# Patient Record
Sex: Female | Born: 1985 | Race: White | Hispanic: No | Marital: Married | Smoking: Never smoker
Health system: Southern US, Community
[De-identification: ages and names within clinical notes are randomized; demographics above are authoritative.]

## PROBLEM LIST (undated history)

## (undated) ENCOUNTER — Inpatient Hospital Stay (HOSPITAL_COMMUNITY): Payer: Self-pay

## (undated) DIAGNOSIS — Z86011 Personal history of benign neoplasm of the brain: Secondary | ICD-10-CM

## (undated) HISTORY — PX: WISDOM TOOTH EXTRACTION: SHX21

## (undated) HISTORY — PX: OTHER SURGICAL HISTORY: SHX169

## (undated) HISTORY — PX: BRAIN SURGERY: SHX531

---
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
## 2007-07-07 ENCOUNTER — Encounter: Admission: RE | Admit: 2007-07-07 | Discharge: 2007-07-07 | Payer: Self-pay | Admitting: Obstetrics and Gynecology

## 2008-02-21 ENCOUNTER — Encounter: Admission: RE | Admit: 2008-02-21 | Discharge: 2008-02-21 | Payer: Self-pay | Admitting: Obstetrics and Gynecology

## 2008-12-14 ENCOUNTER — Encounter: Admission: RE | Admit: 2008-12-14 | Discharge: 2008-12-14 | Payer: Self-pay | Admitting: Obstetrics and Gynecology

## 2009-09-11 ENCOUNTER — Encounter: Admission: RE | Admit: 2009-09-11 | Discharge: 2009-09-11 | Payer: Self-pay | Admitting: Obstetrics and Gynecology

## 2013-06-09 LAB — OB RESULTS CONSOLE GC/CHLAMYDIA
Chlamydia: NEGATIVE
GC PROBE AMP, GENITAL: NEGATIVE

## 2013-06-09 LAB — OB RESULTS CONSOLE HEPATITIS B SURFACE ANTIGEN: HEP B S AG: NEGATIVE

## 2013-06-09 LAB — OB RESULTS CONSOLE ANTIBODY SCREEN: ANTIBODY SCREEN: NEGATIVE

## 2013-06-09 LAB — OB RESULTS CONSOLE HIV ANTIBODY (ROUTINE TESTING): HIV: NONREACTIVE

## 2013-06-09 LAB — OB RESULTS CONSOLE RUBELLA ANTIBODY, IGM: Rubella: IMMUNE

## 2013-06-09 LAB — OB RESULTS CONSOLE ABO/RH: RH Type: POSITIVE

## 2013-06-09 LAB — OB RESULTS CONSOLE RPR: RPR: NONREACTIVE

## 2013-11-17 NOTE — L&D Delivery Note (Signed)
Delivery Note At 6:25 PM a viable and healthy female was delivered via Vaginal, Spontaneous Delivery (Presentation: Left Occiput Anterior).  APGAR: 7, 9; weight pending.   Placenta status: intact, Spontaneous.  Cord: 3 vessels  Anesthesia: Epidural  Episiotomy: None Lacerations: 2nd degree Suture Repair: 3.0 vicryl 4-0 vicryl Est. Blood Loss (mL): 250  Mom to postpartum.  Baby to Couplet care / Skin to Skin.  Teresa Rubio. 12/21/2013, 7:03 PM

## 2013-12-17 ENCOUNTER — Inpatient Hospital Stay (HOSPITAL_COMMUNITY)
Admission: AD | Admit: 2013-12-17 | Discharge: 2013-12-17 | Disposition: A | Discharge status: Home / Self Care | Payer: BC Managed Care – PPO | Source: Ambulatory Visit | Attending: Obstetrics and Gynecology | Admitting: Obstetrics and Gynecology

## 2013-12-17 ENCOUNTER — Encounter (HOSPITAL_COMMUNITY): Payer: Self-pay

## 2013-12-17 DIAGNOSIS — O479 False labor, unspecified: Secondary | ICD-10-CM | POA: Insufficient documentation

## 2013-12-17 HISTORY — DX: Personal history of benign neoplasm of the brain: Z86.011

## 2013-12-17 LAB — POCT FERN TEST: POCT Fern Test: NEGATIVE

## 2013-12-17 NOTE — Discharge Instructions (Signed)
Braxton Hicks Contractions Pregnancy is commonly associated with contractions of the uterus throughout the pregnancy. Towards the end of pregnancy (32 to 34 weeks), these contractions (Braxton Hicks) can develop more often and may become more forceful. This is not true labor because these contractions do not result in opening (dilatation) and thinning of the cervix. They are sometimes difficult to tell apart from true labor because these contractions can be forceful and people have different pain tolerances. You should not feel embarrassed if you go to the hospital with false labor. Sometimes, the only way to tell if you are in true labor is for your caregiver to follow the changes in the cervix. How to tell the difference between true and false labor:  False labor.  The contractions of false labor are usually shorter, irregular and not as hard as those of true labor.  They are often felt in the front of the lower abdomen and in the groin.  They may leave with walking around or changing positions while lying down.  They get weaker and are shorter lasting as time goes on.  These contractions are usually irregular.  They do not usually become progressively stronger, regular and closer together as with true labor.  True labor.  Contractions in true labor last 30 to 70 seconds, become very regular, usually become more intense, and increase in frequency.  They do not go away with walking.  The discomfort is usually felt in the top of the uterus and spreads to the lower abdomen and low back.  True labor can be determined by your caregiver with an exam. This will show that the cervix is dilating and getting thinner. If there are no prenatal problems or other health problems associated with the pregnancy, it is completely safe to be sent home with false labor and await the onset of true labor. HOME CARE INSTRUCTIONS   Keep up with your usual exercises and instructions.  Take medications as  directed.  Keep your regular prenatal appointment.  Eat and drink lightly if you think you are going into labor.  If BH contractions are making you uncomfortable:  Change your activity position from lying down or resting to walking/walking to resting.  Sit and rest in a tub of warm water.  Drink 2 to 3 glasses of water. Dehydration may cause B-H contractions.  Do slow and deep breathing several times an hour. SEEK IMMEDIATE MEDICAL CARE IF:   Your contractions continue to become stronger, more regular, and closer together.  You have a gushing, burst or leaking of fluid from the vagina.  An oral temperature above 102 F (38.9 C) develops.  You have passage of blood-tinged mucus.  You develop vaginal bleeding.  You develop continuous belly (abdominal) pain.  You have low back pain that you never had before.  You feel the baby's head pushing down causing pelvic pressure.  The baby is not moving as much as it used to. Document Released: 11/03/2005 Document Revised: 01/26/2012 Document Reviewed: 08/15/2013 Exit[employer] Patient Information 2014 [employer].  Fetal Movement Counts Patient Name: __________________________________________________ Patient Due Date: ____________________ Performing a fetal movement count is highly recommended in high-risk pregnancies, but it is good for every pregnant woman to do. Your caregiver may ask you to start counting fetal movements at 28 weeks of the pregnancy. Fetal movements often increase:  After eating a full meal.  After physical activity.  After eating or drinking something sweet or cold.  At rest. Pay attention to when you feel   the baby is most active. This will help you notice a pattern of your baby's sleep and wake cycles and what factors contribute to an increase in fetal movement. It is important to perform a fetal movement count at the same time each day when your baby is normally most active.  HOW TO COUNT FETAL  MOVEMENTS 1. Find a quiet and comfortable area to sit or lie down on your left side. Lying on your left side provides the best blood and oxygen circulation to your baby. 2. Write down the day and time on a sheet of paper or in a journal. 3. Start counting kicks, flutters, swishes, rolls, or jabs in a 2 hour period. You should feel at least 10 movements within 2 hours. 4. If you do not feel 10 movements in 2 hours, wait 2 3 hours and count again. Look for a change in the pattern or not enough counts in 2 hours. SEEK MEDICAL CARE IF:  You feel less than 10 counts in 2 hours, tried twice.  There is no movement in over an hour.  The pattern is changing or taking longer each day to reach 10 counts in 2 hours.  You feel the baby is not moving as he or she usually does. Date: ____________ Movements: ____________ Start time: ____________ Finish time: ____________  Date: ____________ Movements: ____________ Start time: ____________ Finish time: ____________ Date: ____________ Movements: ____________ Start time: ____________ Finish time: ____________ Date: ____________ Movements: ____________ Start time: ____________ Finish time: ____________ Date: ____________ Movements: ____________ Start time: ____________ Finish time: ____________ Date: ____________ Movements: ____________ Start time: ____________ Finish time: ____________ Date: ____________ Movements: ____________ Start time: ____________ Finish time: ____________ Date: ____________ Movements: ____________ Start time: ____________ Finish time: ____________  Date: ____________ Movements: ____________ Start time: ____________ Finish time: ____________ Date: ____________ Movements: ____________ Start time: ____________ Finish time: ____________ Date: ____________ Movements: ____________ Start time: ____________ Finish time: ____________ Date: ____________ Movements: ____________ Start time: ____________ Finish time: ____________ Date: ____________  Movements: ____________ Start time: ____________ Finish time: ____________ Date: ____________ Movements: ____________ Start time: ____________ Finish time: ____________ Date: ____________ Movements: ____________ Start time: ____________ Finish time: ____________  Date: ____________ Movements: ____________ Start time: ____________ Finish time: ____________ Date: ____________ Movements: ____________ Start time: ____________ Finish time: ____________ Date: ____________ Movements: ____________ Start time: ____________ Finish time: ____________ Date: ____________ Movements: ____________ Start time: ____________ Finish time: ____________ Date: ____________ Movements: ____________ Start time: ____________ Finish time: ____________ Date: ____________ Movements: ____________ Start time: ____________ Finish time: ____________ Date: ____________ Movements: ____________ Start time: ____________ Finish time: ____________  Date: ____________ Movements: ____________ Start time: ____________ Finish time: ____________ Date: ____________ Movements: ____________ Start time: ____________ Finish time: ____________ Date: ____________ Movements: ____________ Start time: ____________ Finish time: ____________ Date: ____________ Movements: ____________ Start time: ____________ Finish time: ____________ Date: ____________ Movements: ____________ Start time: ____________ Finish time: ____________ Date: ____________ Movements: ____________ Start time: ____________ Finish time: ____________ Date: ____________ Movements: ____________ Start time: ____________ Finish time: ____________  Date: ____________ Movements: ____________ Start time: ____________ Finish time: ____________ Date: ____________ Movements: ____________ Start time: ____________ Finish time: ____________ Date: ____________ Movements: ____________ Start time: ____________ Finish time: ____________ Date: ____________ Movements: ____________ Start time:  ____________ Finish time: ____________ Date: ____________ Movements: ____________ Start time: ____________ Finish time: ____________ Date: ____________ Movements: ____________ Start time: ____________ Finish time: ____________ Date: ____________ Movements: ____________ Start time: ____________ Finish time: ____________  Date: ____________ Movements: ____________ Start time: ____________ Finish time: ____________ Date: ____________ Movements: ____________ Start   time: ____________ Finish time: ____________ Date: ____________ Movements: ____________ Start time: ____________ Finish time: ____________ Date: ____________ Movements: ____________ Start time: ____________ Finish time: ____________ Date: ____________ Movements: ____________ Start time: ____________ Finish time: ____________ Date: ____________ Movements: ____________ Start time: ____________ Finish time: ____________ Date: ____________ Movements: ____________ Start time: ____________ Finish time: ____________  Date: ____________ Movements: ____________ Start time: ____________ Finish time: ____________ Date: ____________ Movements: ____________ Start time: ____________ Finish time: ____________ Date: ____________ Movements: ____________ Start time: ____________ Finish time: ____________ Date: ____________ Movements: ____________ Start time: ____________ Finish time: ____________ Date: ____________ Movements: ____________ Start time: ____________ Finish time: ____________ Date: ____________ Movements: ____________ Start time: ____________ Finish time: ____________ Date: ____________ Movements: ____________ Start time: ____________ Finish time: ____________  Date: ____________ Movements: ____________ Start time: ____________ Finish time: ____________ Date: ____________ Movements: ____________ Start time: ____________ Finish time: ____________ Date: ____________ Movements: ____________ Start time: ____________ Finish time: ____________ Date:  ____________ Movements: ____________ Start time: ____________ Finish time: ____________ Date: ____________ Movements: ____________ Start time: ____________ Finish time: ____________ Date: ____________ Movements: ____________ Start time: ____________ Finish time: ____________ Document Released: 12/03/2006 Document Revised: 10/20/2012 Document Reviewed: 08/30/2012 ExitCare Patient Information 2014 [employer].  

## 2013-12-17 NOTE — MAU Note (Signed)
Patient presents with complaint of contractions since 1300 today.

## 2013-12-20 ENCOUNTER — Telehealth (HOSPITAL_COMMUNITY): Payer: Self-pay | Admitting: *Deleted

## 2013-12-20 ENCOUNTER — Encounter (HOSPITAL_COMMUNITY): Payer: Self-pay | Admitting: *Deleted

## 2013-12-20 LAB — OB RESULTS CONSOLE GBS: STREP GROUP B AG: NEGATIVE

## 2013-12-20 NOTE — Telephone Encounter (Signed)
Preadmission screen  

## 2013-12-21 ENCOUNTER — Inpatient Hospital Stay (HOSPITAL_COMMUNITY): Payer: BC Managed Care – PPO | Admitting: Anesthesiology

## 2013-12-21 ENCOUNTER — Encounter (HOSPITAL_COMMUNITY): Payer: Self-pay

## 2013-12-21 ENCOUNTER — Encounter (HOSPITAL_COMMUNITY): Payer: BC Managed Care – PPO | Admitting: Anesthesiology

## 2013-12-21 ENCOUNTER — Inpatient Hospital Stay (HOSPITAL_COMMUNITY)
Admission: RE | Admit: 2013-12-21 | Discharge: 2013-12-23 | DRG: 775 | Disposition: A | Discharge status: Home / Self Care | Payer: BC Managed Care – PPO | Source: Ambulatory Visit | Attending: Obstetrics and Gynecology | Admitting: Obstetrics and Gynecology

## 2013-12-21 DIAGNOSIS — O480 Post-term pregnancy: Secondary | ICD-10-CM | POA: Diagnosis present

## 2013-12-21 DIAGNOSIS — Z86011 Personal history of benign neoplasm of the brain: Secondary | ICD-10-CM

## 2013-12-21 DIAGNOSIS — O360990 Maternal care for other rhesus isoimmunization, unspecified trimester, not applicable or unspecified: Secondary | ICD-10-CM | POA: Diagnosis present

## 2013-12-21 LAB — CBC
HEMATOCRIT: 38.4 % (ref 36.0–46.0)
HEMOGLOBIN: 13.8 g/dL (ref 12.0–15.0)
MCH: 30.9 pg (ref 26.0–34.0)
MCHC: 35.9 g/dL (ref 30.0–36.0)
MCV: 85.9 fL (ref 78.0–100.0)
Platelets: 183 10*3/uL (ref 150–400)
RBC: 4.47 MIL/uL (ref 3.87–5.11)
RDW: 12.6 % (ref 11.5–15.5)
WBC: 11.5 10*3/uL — ABNORMAL HIGH (ref 4.0–10.5)

## 2013-12-21 LAB — TYPE AND SCREEN
ABO/RH(D): B NEG
ANTIBODY SCREEN: NEGATIVE

## 2013-12-21 LAB — RPR: RPR Ser Ql: NONREACTIVE

## 2013-12-21 LAB — OB RESULTS CONSOLE ABO/RH: RH TYPE: NEGATIVE

## 2013-12-21 LAB — ABO/RH: ABO/RH(D): B NEG

## 2013-12-21 MED ORDER — OXYTOCIN BOLUS FROM INFUSION: 500.0000 mL | INTRAVENOUS | Status: DC

## 2013-12-21 MED ORDER — OXYTOCIN 40 UNITS IN LACTATED RINGERS INFUSION - SIMPLE MED
62.5000 mL/h | INTRAVENOUS | Status: DC
  Administered 2013-12-21: 62.5 mL/h via INTRAVENOUS
  Administered 2013-12-21: 999 mL/h via INTRAVENOUS

## 2013-12-21 MED ORDER — IBUPROFEN 600 MG PO TABS
600.0000 mg | ORAL_TABLET | Freq: Four times a day (QID) | ORAL | Status: DC | PRN
  Administered 2013-12-21: 600 mg via ORAL
  Filled 2013-12-21: qty 1

## 2013-12-21 MED ORDER — EPHEDRINE 5 MG/ML INJ
10.0000 mg | INTRAVENOUS | Status: DC | PRN
  Filled 2013-12-21: qty 2

## 2013-12-21 MED ORDER — OXYTOCIN 40 UNITS IN LACTATED RINGERS INFUSION - SIMPLE MED
1.0000 m[IU]/min | INTRAVENOUS | Status: DC
  Administered 2013-12-21: 2 m[IU]/min via INTRAVENOUS
  Administered 2013-12-21: 4 m[IU]/min via INTRAVENOUS
  Filled 2013-12-21: qty 1000

## 2013-12-21 MED ORDER — PHENYLEPHRINE 40 MCG/ML (10ML) SYRINGE FOR IV PUSH (FOR BLOOD PRESSURE SUPPORT)
80.0000 ug | PREFILLED_SYRINGE | INTRAVENOUS | Status: DC | PRN
  Filled 2013-12-21: qty 2
  Filled 2013-12-21: qty 10

## 2013-12-21 MED ORDER — LACTATED RINGERS IV SOLN
500.0000 mL | Freq: Once | INTRAVENOUS | Status: AC
  Administered 2013-12-21: 12:00:00 via INTRAVENOUS

## 2013-12-21 MED ORDER — ONDANSETRON HCL 4 MG/2ML IJ SOLN
4.0000 mg | INTRAMUSCULAR | Status: DC | PRN
Start: 2013-12-21 — End: 2013-12-23

## 2013-12-21 MED ORDER — FENTANYL 2.5 MCG/ML BUPIVACAINE 1/10 % EPIDURAL INFUSION (WH - ANES)
14.0000 mL/h | INTRAMUSCULAR | Status: DC | PRN
  Administered 2013-12-21: 14 mL/h via EPIDURAL
  Filled 2013-12-21: qty 125

## 2013-12-21 MED ORDER — DIPHENHYDRAMINE HCL 25 MG PO CAPS: 25.0000 mg | ORAL_CAPSULE | Freq: Four times a day (QID) | ORAL | Status: DC | PRN

## 2013-12-21 MED ORDER — BUTORPHANOL TARTRATE 1 MG/ML IJ SOLN: 1.0000 mg | INTRAMUSCULAR | Status: DC | PRN

## 2013-12-21 MED ORDER — ONDANSETRON HCL 4 MG/2ML IJ SOLN: 4.0000 mg | Freq: Four times a day (QID) | INTRAMUSCULAR | Status: DC | PRN

## 2013-12-21 MED ORDER — BENZOCAINE-MENTHOL 20-0.5 % EX AERO
1.0000 "application " | INHALATION_SPRAY | CUTANEOUS | Status: DC | PRN
  Administered 2013-12-21: 1 via TOPICAL
  Filled 2013-12-21: qty 56

## 2013-12-21 MED ORDER — ONDANSETRON HCL 4 MG PO TABS: 4.0000 mg | ORAL_TABLET | ORAL | Status: DC | PRN

## 2013-12-21 MED ORDER — ZOLPIDEM TARTRATE 5 MG PO TABS: 5.0000 mg | ORAL_TABLET | Freq: Every evening | ORAL | Status: DC | PRN

## 2013-12-21 MED ORDER — OXYCODONE-ACETAMINOPHEN 5-325 MG PO TABS: 1.0000 | ORAL_TABLET | ORAL | Status: DC | PRN

## 2013-12-21 MED ORDER — WITCH HAZEL-GLYCERIN EX PADS
1.0000 "application " | MEDICATED_PAD | CUTANEOUS | Status: DC | PRN
  Administered 2013-12-21: 1 via TOPICAL

## 2013-12-21 MED ORDER — PRENATAL MULTIVITAMIN CH
1.0000 | ORAL_TABLET | Freq: Every day | ORAL | Status: DC
  Administered 2013-12-22 – 2013-12-23 (×2): 1 via ORAL
  Filled 2013-12-21 (×2): qty 1

## 2013-12-21 MED ORDER — LIDOCAINE HCL (PF) 1 % IJ SOLN
INTRAMUSCULAR | Status: DC | PRN
Start: 2013-12-21 — End: 2013-12-21
  Administered 2013-12-21 (×4): 4 mL

## 2013-12-21 MED ORDER — LANOLIN HYDROUS EX OINT: TOPICAL_OINTMENT | CUTANEOUS | Status: DC | PRN

## 2013-12-21 MED ORDER — LIDOCAINE HCL (PF) 1 % IJ SOLN
30.0000 mL | INTRAMUSCULAR | Status: DC | PRN
  Filled 2013-12-21 (×2): qty 30

## 2013-12-21 MED ORDER — DIPHENHYDRAMINE HCL 50 MG/ML IJ SOLN: 12.5000 mg | INTRAMUSCULAR | Status: DC | PRN

## 2013-12-21 MED ORDER — DIBUCAINE 1 % RE OINT
1.0000 "application " | TOPICAL_OINTMENT | RECTAL | Status: DC | PRN
  Administered 2013-12-21: 1 via RECTAL
  Filled 2013-12-21: qty 28

## 2013-12-21 MED ORDER — EPHEDRINE 5 MG/ML INJ
10.0000 mg | INTRAVENOUS | Status: DC | PRN
  Filled 2013-12-21: qty 2
  Filled 2013-12-21: qty 4

## 2013-12-21 MED ORDER — IBUPROFEN 600 MG PO TABS
600.0000 mg | ORAL_TABLET | Freq: Four times a day (QID) | ORAL | Status: DC
  Administered 2013-12-22 – 2013-12-23 (×6): 600 mg via ORAL
  Filled 2013-12-21 (×7): qty 1

## 2013-12-21 MED ORDER — ACETAMINOPHEN 325 MG PO TABS: 650.0000 mg | ORAL_TABLET | ORAL | Status: DC | PRN

## 2013-12-21 MED ORDER — SIMETHICONE 80 MG PO CHEW
80.0000 mg | CHEWABLE_TABLET | ORAL | Status: DC | PRN
Start: 2013-12-21 — End: 2013-12-23

## 2013-12-21 MED ORDER — LACTATED RINGERS IV SOLN: 500.0000 mL | INTRAVENOUS | Status: DC | PRN

## 2013-12-21 MED ORDER — OXYCODONE-ACETAMINOPHEN 5-325 MG PO TABS
1.0000 | ORAL_TABLET | ORAL | Status: DC | PRN
  Administered 2013-12-22: 1 via ORAL
  Filled 2013-12-21: qty 1

## 2013-12-21 MED ORDER — TETANUS-DIPHTH-ACELL PERTUSSIS 5-2.5-18.5 LF-MCG/0.5 IM SUSP
0.5000 mL | Freq: Once | INTRAMUSCULAR | Status: AC
  Administered 2013-12-22: 0.5 mL via INTRAMUSCULAR
  Filled 2013-12-21: qty 0.5

## 2013-12-21 MED ORDER — SENNOSIDES-DOCUSATE SODIUM 8.6-50 MG PO TABS
2.0000 | ORAL_TABLET | ORAL | Status: DC
  Administered 2013-12-22 – 2013-12-23 (×2): 2 via ORAL
  Filled 2013-12-21 (×2): qty 2

## 2013-12-21 MED ORDER — TERBUTALINE SULFATE 1 MG/ML IJ SOLN: 0.2500 mg | Freq: Once | INTRAMUSCULAR | Status: DC | PRN

## 2013-12-21 MED ORDER — LACTATED RINGERS IV SOLN
INTRAVENOUS | Status: DC
  Administered 2013-12-21: 12:00:00 via INTRAVENOUS
  Administered 2013-12-21: 125 mL/h via INTRAVENOUS

## 2013-12-21 MED ORDER — PHENYLEPHRINE 40 MCG/ML (10ML) SYRINGE FOR IV PUSH (FOR BLOOD PRESSURE SUPPORT)
80.0000 ug | PREFILLED_SYRINGE | INTRAVENOUS | Status: DC | PRN
  Filled 2013-12-21: qty 2

## 2013-12-21 MED ORDER — CITRIC ACID-SODIUM CITRATE 334-500 MG/5ML PO SOLN: 30.0000 mL | ORAL | Status: DC | PRN

## 2013-12-21 NOTE — Anesthesia Procedure Notes (Addendum)
Epidural Patient location during procedure: OB Start time: 12/21/2013 11:42 AM  Staffing Performed by: anesthesiologist   Preanesthetic Checklist Completed: patient identified, site marked, surgical consent, pre-op evaluation, timeout performed, IV checked, risks and benefits discussed and monitors and equipment checked  Epidural Patient position: sitting Prep: site prepped and draped and DuraPrep Patient monitoring: continuous pulse ox and blood pressure Approach: midline Injection technique: LOR air  Needle:  Needle type: Tuohy  Needle gauge: 17 G Needle length: 9 cm and 9 Needle insertion depth: 6.5 cm Catheter type: closed end flexible Catheter size: 19 Gauge Catheter at skin depth: 11.5 cm Test dose: negative  Assessment Events: blood not aspirated, injection not painful, no injection resistance, negative IV test and no paresthesia  Additional Notes Discussed risk of headache, infection, bleeding, nerve injury and failed or incomplete block.  Patient voices understanding and wishes to proceed.  Epidural placed easily on first attempt.  No paresthesia.  Patient tolerated procedure well with no apparent complications.  Charlton Haws, MDReason for block:procedure for pain

## 2013-12-21 NOTE — Anesthesia Preprocedure Evaluation (Signed)
Anesthesia Evaluation  Patient identified by MRN, date of birth, ID band Patient awake    Reviewed: Allergy & Precautions, H&P , NPO status , Patient's Chart, lab work & pertinent test results, reviewed documented beta blocker date and time   History of Anesthesia Complications Negative for: history of anesthetic complications  Airway Mallampati: II TM Distance: >3 FB Neck ROM: full    Dental  (+) Teeth Intact   Pulmonary neg pulmonary ROS,  breath sounds clear to auscultation        Cardiovascular negative cardio ROS  Rhythm:regular Rate:Normal     Neuro/Psych H/o benign brain tumor, removed in 1998, 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 negative neurological ROS  negative psych ROS   GI/Hepatic negative GI ROS, Neg liver ROS,   Endo/Other  negative endocrine ROS  Renal/GU negative Renal ROS     Musculoskeletal   Abdominal   Peds  Hematology negative hematology ROS (+)   Anesthesia Other Findings   Reproductive/Obstetrics (+) Pregnancy                           Anesthesia Physical Anesthesia Plan  ASA: II  Anesthesia Plan: Epidural   Post-op Pain Management:    Induction:   Airway Management Planned:   Additional Equipment:   Intra-op Plan:   Post-operative Plan:   Informed Consent: I have reviewed the patients History and Physical, chart, labs and discussed the procedure including the risks, benefits and alternatives for the proposed anesthesia with the patient or authorized representative who has indicated his/her understanding and acceptance.     Plan Discussed with:   Anesthesia Plan Comments:         Anesthesia Quick Evaluation

## 2013-12-21 NOTE — Progress Notes (Signed)
Dr Harrington Challenger aware that patient is complete.  We will stop pushing until she is done in the operating room.  Patient is compliant with this.  If needed we can have [facility] stand by for delivery.

## 2013-12-22 LAB — CBC
HEMATOCRIT: 34 % — AB (ref 36.0–46.0)
HEMOGLOBIN: 12 g/dL (ref 12.0–15.0)
MCH: 30.3 pg (ref 26.0–34.0)
MCHC: 35.3 g/dL (ref 30.0–36.0)
MCV: 85.9 fL (ref 78.0–100.0)
Platelets: 167 10*3/uL (ref 150–400)
RBC: 3.96 MIL/uL (ref 3.87–5.11)
RDW: 12.7 % (ref 11.5–15.5)
WBC: 17.5 10*3/uL — AB (ref 4.0–10.5)

## 2013-12-22 NOTE — Progress Notes (Signed)
PPD#0 Pt without complaints. Wants circ. Needs consent.

## 2013-12-22 NOTE — H&P (Signed)
Teresa Rubio is a 28 y.o. female presenting for PD IOL  28 yo G1P0 @ 40+1 presents for PD IOL. Her pregnancy has been uncomplicated. She is Rh negative and received Rhogam @ 28 weeks.  History OB History   Grav Para Term Preterm Abortions TAB SAB Ect Mult Living   1 1 1       1      Past Medical History  Diagnosis Date  . History of benign brain tumor 01/1997   Past Surgical History  Procedure Laterality Date  . Brain surgery    . Wisdom tooth extraction     Family History: family history includes Diabetes in her father and maternal grandfather; Emphysema in her maternal grandfather; Heart disease in her maternal grandfather and maternal grandmother; Hyperlipidemia in her mother; Hypertension in her maternal grandmother and mother. Social History:  reports that she has never smoked. She has never used smokeless tobacco. She reports that she drinks alcohol. She reports that she does not use illicit drugs.   Prenatal Transfer Tool  Maternal Diabetes: No Genetic Screening: Declined Maternal Ultrasounds/Referrals: Normal Fetal Ultrasounds or other Referrals:  None Maternal Substance Abuse:  No Significant Maternal Medications:  None Significant Maternal Lab Results:  None Other Comments:  None  ROS: as above  Dilation: 10 Effacement (%): 100 Station: +2 Exam by:: Teresa Lawless, RN Blood pressure 106/59, pulse 79, temperature 98 F (36.7 C), temperature source Oral, resp. rate 18, height 5\' 5"  (1.651 m), weight 70.761 kg (156 lb), last menstrual period 03/15/2013, SpO2 95.00%, unknown if currently breastfeeding. Exam Physical Exam  Prenatal labs: ABO, Rh: --/--/B NEG, B NEG (02/04 0840) Antibody: NEG (02/04 0840) Rubella: Immune (07/24 0000) RPR: NON REACTIVE (02/04 0840)  HBsAg: Negative (07/24 0000)  HIV: Non-reactive (07/24 0000)  GBS: Negative (02/03 0000)   Assessment/Plan: 1) Admit 2) Arom/ pitocin 3) Anticipate SVD   Teresa Rubio H. 12/22/2013, 2:03  AM

## 2013-12-22 NOTE — Lactation Note (Signed)
This note was copied from the chart of [place]. Lactation Consultation Note  Patient Name: Teresa Rubio YIFOY'D Date: 12/22/2013 Reason for consult: Initial assessment Per mom I feel baby is breast feeding well because he is peeing and pooping  But he doesn't always feel like he is getting on as deep as he should. Per mom and dad baby recently fed for 15 mins at 1635. Presently baby being  held by visitor and is sound asleep.  LC encouraged mom to call [place] when abby is showing  Feeding cues . Also instructed mom on the use comfort gels for tender nipples. Per mom and dad attended breast feeding classes at [facility] and are familiar with [facility]  and the [facility] O/P services. Brought their pamphlet to the hospital.     Maternal Data Formula Feeding for Exclusion: No Infant to breast within first hour of birth: Yes Does the patient have breastfeeding experience prior to this delivery?: No  Feeding Feeding Type:  (per dad recently fed at 1635 for 15 mins ) Length of feed: 15 min  LATCH Score/Interventions       Type of Nipple:  (per mom nipples are feeling tender with latch, enc to page for latch check )              Lactation Tools Discussed/Used     Consult Status Consult Status: Follow-up Date: 12/22/13 Follow-up type: In-patient    Myer Haff 12/22/2013, 5:48 PM

## 2013-12-22 NOTE — Lactation Note (Signed)
This note was copied from the chart of Teresa Rubio. Lactation Consultation Note Follow up at 24 hours of age.  Mom unsure if baby is getting a deep latch and has a little pain.  Mom using cross cradle hold, few attempts to get baby latched well with wide flanged lips.  Offered mom supports.  Audible swallows with suckling bursts.  Encouraged comfort gels.  Basics reviewed, encouraged feeding with early cues and discussed cluster feeding.  Mom to call for assist as needed.    Patient Name: [name] [id number] Date: 12/22/2013 Reason for consult: Follow-up assessment   Maternal Data Formula Feeding for Exclusion: No Infant to breast within first hour of birth: Yes Does the patient have breastfeeding experience prior to this delivery?: No  Feeding Feeding Type: Breast Fed Length of feed: 15 min  LATCH Score/Interventions Latch: Repeated attempts needed to sustain latch, nipple held in mouth throughout feeding, stimulation needed to elicit sucking reflex.  Audible Swallowing: Spontaneous and intermittent Intervention(s): Hand expression  Type of Nipple: Everted at rest and after stimulation  Comfort (Breast/Nipple): Filling, red/small blisters or bruises, mild/mod discomfort  Problem noted: Mild/Moderate discomfort  Hold (Positioning): Assistance needed to correctly position infant at breast and maintain latch. Intervention(s): Breastfeeding basics reviewed;Support Pillows;Skin to skin  LATCH Score: 7  Lactation Tools Discussed/Used Tools: Comfort gels   Consult Status Consult Status: Follow-up Date: 12/23/13 Follow-up type: In-patient    [place], Justine Null 12/22/2013, 7:00 PM

## 2013-12-22 NOTE — Anesthesia Postprocedure Evaluation (Signed)
  Anesthesia Post-op Note  Patient: Teresa Rubio  Procedure(s) Performed: * No procedures listed *  Patient Location: Mother/Baby  Anesthesia Type:Epidural  Level of Consciousness: awake  Airway and Oxygen Therapy: Patient Spontanous Breathing  Post-op Pain: mild  Post-op Assessment: Patient's Cardiovascular Status Stable and Respiratory Function Stable  Post-op Vital Signs: stable  Complications: No apparent anesthesia complications

## 2013-12-23 NOTE — Lactation Note (Signed)
This note was copied from the chart of [place]. Lactation Consultation Note Follow up consult:  Baby Teresa 31 hours old.  Mother was able to hand express drops of colostrum. Mother put baby in cross cradle hold, baby latched easily with both lips flanged, rhythmical sucking, swallows observed.  Mother's right nipple pink and sore, using comfort gels and breastmilk.  Reviewed hand pump, deep wide latch, breastfeeding 8-12 times a day, engorgement care and lactation support services.   Patient Name: Teresa Rubio [id number]'O Date: 12/23/2013 Reason for consult: Follow-up assessment   Maternal Data    Feeding Feeding Type: Breast Fed Length of feed: 30 min  LATCH Score/Interventions Latch: Grasps breast easily, tongue down, lips flanged, rhythmical sucking. Intervention(s): Breast massage;Assist with latch  Audible Swallowing: Spontaneous and intermittent Intervention(s): Skin to skin;Hand expression  Type of Nipple: Everted at rest and after stimulation  Comfort (Breast/Nipple): Filling, red/small blisters or bruises, mild/mod discomfort  Problem noted: Mild/Moderate discomfort Interventions (Mild/moderate discomfort): Comfort gels  Hold (Positioning): No assistance needed to correctly position infant at breast.  LATCH Score: 9  Lactation Tools Discussed/Used     Consult Status Consult Status: Complete    Vivianne Master Boschen 12/23/2013, 9:30 AM

## 2013-12-23 NOTE — Discharge Summary (Signed)
Obstetric Discharge Summary Reason for Admission: induction of labor Prenatal Procedures: ultrasound Intrapartum Procedures: spontaneous vaginal delivery Postpartum Procedures: none Complications-Operative and Postpartum: 2nd degree perineal laceration Hemoglobin  Date Value Range Status  12/22/2013 12.0  12.0 - 15.0 g/dL Final     HCT  Date Value Range Status  12/22/2013 34.0* 36.0 - 46.0 % Final    Physical Exam:  General: alert and cooperative Lochia: appropriate Uterine Fundus: firm DVT Evaluation: No evidence of DVT seen on physical exam.  Discharge Diagnoses: Term Pregnancy-delivered  Discharge Information: Date: 12/23/2013 Activity: pelvic rest Diet: routine Medications: PNV and Ibuprofen Condition: stable Instructions: refer to practice specific booklet Discharge to: home Follow-up Information   Follow up with Marcial Pacas., MD In 4 weeks.   Specialty:  Obstetrics and Gynecology   Contact information:   [address] [place] [address] [phone]       Newborn Data: Live born female  Birth Weight: 7 lb 7 oz (3374 g) APGAR: 7, 9  Home with mother.  Teresa Rubio 12/23/2013, 10:07 AM

## 2014-09-18 ENCOUNTER — Encounter (HOSPITAL_COMMUNITY): Payer: Self-pay

## 2019-02-22 ENCOUNTER — Other Ambulatory Visit: Payer: Self-pay | Admitting: Obstetrics and Gynecology

## 2019-02-22 DIAGNOSIS — N644 Mastodynia: Secondary | ICD-10-CM

## 2019-03-18 ENCOUNTER — Ambulatory Visit
Admission: RE | Admit: 2019-03-18 | Discharge: 2019-03-18 | Disposition: A | Discharge status: Home / Self Care | Payer: BLUE CROSS/BLUE SHIELD | Source: Ambulatory Visit | Attending: Obstetrics and Gynecology | Admitting: Obstetrics and Gynecology

## 2019-03-18 ENCOUNTER — Other Ambulatory Visit: Payer: Self-pay

## 2019-03-18 ENCOUNTER — Other Ambulatory Visit: Payer: Self-pay | Admitting: Obstetrics and Gynecology

## 2019-03-18 DIAGNOSIS — N644 Mastodynia: Secondary | ICD-10-CM

## 2019-03-18 DIAGNOSIS — N6320 Unspecified lump in the left breast, unspecified quadrant: Secondary | ICD-10-CM

## 2019-03-22 ENCOUNTER — Other Ambulatory Visit: Payer: Self-pay

## 2019-03-22 ENCOUNTER — Ambulatory Visit
Admission: RE | Admit: 2019-03-22 | Discharge: 2019-03-22 | Disposition: A | Discharge status: Home / Self Care | Payer: BLUE CROSS/BLUE SHIELD | Source: Ambulatory Visit | Attending: Obstetrics and Gynecology | Admitting: Obstetrics and Gynecology

## 2019-03-22 DIAGNOSIS — N6320 Unspecified lump in the left breast, unspecified quadrant: Secondary | ICD-10-CM

## 2019-07-07 LAB — OB RESULTS CONSOLE RPR: RPR: NONREACTIVE

## 2019-07-07 LAB — OB RESULTS CONSOLE ABO/RH: RH Type: NEGATIVE

## 2019-07-07 LAB — OB RESULTS CONSOLE ANTIBODY SCREEN: Antibody Screen: NEGATIVE

## 2019-07-07 LAB — OB RESULTS CONSOLE RUBELLA ANTIBODY, IGM: Rubella: IMMUNE

## 2019-07-07 LAB — OB RESULTS CONSOLE HEPATITIS B SURFACE ANTIGEN: Hepatitis B Surface Ag: NEGATIVE

## 2019-07-07 LAB — OB RESULTS CONSOLE GC/CHLAMYDIA
Chlamydia: NEGATIVE
Gonorrhea: NEGATIVE

## 2019-07-07 LAB — OB RESULTS CONSOLE HIV ANTIBODY (ROUTINE TESTING): HIV: NONREACTIVE

## 2019-11-18 NOTE — L&D Delivery Note (Signed)
Delivery Note At 11:20 AM a viable and healthy female was delivered via Vaginal, Spontaneous (Presentation:   Occiput Anterior).  APGAR: 8, 9; weight pending .   Placenta status: Manual removal;Pathology, Adherent.  Cord: 3 vessels with the following complications: None.   The patient rapidly progressed to completely dilated. The patient delivered a vigorous female infant in the vertex OA presentation over intact perineum. The cord was clamped and cut after a 1 minute delay. THe placenta did not immediately deliver. Pitocin was initiated. After approximately 20 minutes the membranes began to separate from the placenta and the placenta required manual removal. The placenta was inspected and found to be completely removed. THe uterus was evacuated several times to confirm complete uterine evacuation. The patient continued to have some persistent bleeding and methergine and TXA were administered. The vagina was explored and a pumping vessel was noted on the anterior cervix. Hemostasis was achieved with a figure of 8 suture. Mom and baby are doing well after delivery  Anesthesia: Nitrous Oxide ;Local Episiotomy:  None Lacerations: 2nd degree;Cervical Suture Repair: 3.0 vicryl Est. Blood Loss (mL): 463  Mom to postpartum.  Baby to Couplet care / Skin to Skin.  Vanessa Kick 01/26/2020, 12:34 PM

## 2020-01-25 ENCOUNTER — Other Ambulatory Visit: Payer: Self-pay | Admitting: Obstetrics and Gynecology

## 2020-01-25 ENCOUNTER — Telehealth (HOSPITAL_COMMUNITY): Payer: Self-pay | Admitting: *Deleted

## 2020-01-25 ENCOUNTER — Encounter (HOSPITAL_COMMUNITY): Payer: Self-pay | Admitting: *Deleted

## 2020-01-25 NOTE — Telephone Encounter (Signed)
Preadmission screen Pt declines out patient covid screen due to working.  Will need covid test on admission tomorrow

## 2020-01-26 ENCOUNTER — Inpatient Hospital Stay (HOSPITAL_COMMUNITY)
Admission: AD | Admit: 2020-01-26 | Discharge: 2020-01-27 | DRG: 806 | Disposition: A | Discharge status: Home / Self Care | Payer: BC Managed Care – PPO | Attending: Obstetrics and Gynecology | Admitting: Obstetrics and Gynecology

## 2020-01-26 ENCOUNTER — Encounter (HOSPITAL_COMMUNITY): Payer: Self-pay | Admitting: Obstetrics and Gynecology

## 2020-01-26 ENCOUNTER — Inpatient Hospital Stay (HOSPITAL_COMMUNITY): Payer: BC Managed Care – PPO

## 2020-01-26 DIAGNOSIS — Z3A39 39 weeks gestation of pregnancy: Secondary | ICD-10-CM

## 2020-01-26 DIAGNOSIS — Z20822 Contact with and (suspected) exposure to covid-19: Secondary | ICD-10-CM | POA: Diagnosis present

## 2020-01-26 DIAGNOSIS — O26893 Other specified pregnancy related conditions, third trimester: Secondary | ICD-10-CM | POA: Diagnosis present

## 2020-01-26 LAB — TYPE AND SCREEN
ABO/RH(D): B NEG
Antibody Screen: POSITIVE

## 2020-01-26 LAB — CBC
HCT: 37.2 % (ref 36.0–46.0)
Hemoglobin: 12.4 g/dL (ref 12.0–15.0)
MCH: 28.6 pg (ref 26.0–34.0)
MCHC: 33.3 g/dL (ref 30.0–36.0)
MCV: 85.9 fL (ref 80.0–100.0)
Platelets: 158 10*3/uL (ref 150–400)
RBC: 4.33 MIL/uL (ref 3.87–5.11)
RDW: 12.2 % (ref 11.5–15.5)
WBC: 12.4 10*3/uL — ABNORMAL HIGH (ref 4.0–10.5)
nRBC: 0 % (ref 0.0–0.2)

## 2020-01-26 LAB — SARS CORONAVIRUS 2 (TAT 6-24 HRS): SARS Coronavirus 2: NEGATIVE

## 2020-01-26 LAB — RPR: RPR Ser Ql: NONREACTIVE

## 2020-01-26 MED ORDER — EPHEDRINE 5 MG/ML INJ: 10.0000 mg | INTRAVENOUS | Status: DC | PRN

## 2020-01-26 MED ORDER — METHYLERGONOVINE MALEATE 0.2 MG/ML IJ SOLN
INTRAMUSCULAR | Status: AC
  Administered 2020-01-26: 0.2 mg
  Filled 2020-01-26: qty 1

## 2020-01-26 MED ORDER — OXYTOCIN BOLUS FROM INFUSION
500.0000 mL | Freq: Once | INTRAVENOUS | Status: AC
  Administered 2020-01-26: 500 mL via INTRAVENOUS

## 2020-01-26 MED ORDER — METHYLERGONOVINE MALEATE 0.2 MG/ML IJ SOLN: 0.2000 mg | Freq: Once | INTRAMUSCULAR | Status: DC

## 2020-01-26 MED ORDER — COCONUT OIL OIL
1.0000 "application " | TOPICAL_OIL | Status: DC | PRN
  Administered 2020-01-27: 1 via TOPICAL

## 2020-01-26 MED ORDER — DIPHENHYDRAMINE HCL 25 MG PO CAPS: 25.0000 mg | ORAL_CAPSULE | Freq: Four times a day (QID) | ORAL | Status: DC | PRN

## 2020-01-26 MED ORDER — ONDANSETRON HCL 4 MG PO TABS: 4.0000 mg | ORAL_TABLET | ORAL | Status: DC | PRN

## 2020-01-26 MED ORDER — OXYCODONE HCL 5 MG PO TABS: 5.0000 mg | ORAL_TABLET | ORAL | Status: DC | PRN

## 2020-01-26 MED ORDER — CEFAZOLIN SODIUM-DEXTROSE 1-4 GM/50ML-% IV SOLN
1.0000 g | Freq: Three times a day (TID) | INTRAVENOUS | Status: AC
  Administered 2020-01-26 – 2020-01-27 (×3): 1 g via INTRAVENOUS
  Filled 2020-01-26 (×3): qty 50

## 2020-01-26 MED ORDER — LIDOCAINE HCL (PF) 1 % IJ SOLN
30.0000 mL | INTRAMUSCULAR | Status: AC | PRN
  Administered 2020-01-26: 30 mL via SUBCUTANEOUS
  Filled 2020-01-26: qty 30

## 2020-01-26 MED ORDER — TERBUTALINE SULFATE 1 MG/ML IJ SOLN: 0.2500 mg | Freq: Once | INTRAMUSCULAR | Status: DC | PRN

## 2020-01-26 MED ORDER — ONDANSETRON HCL 4 MG/2ML IJ SOLN
4.0000 mg | Freq: Four times a day (QID) | INTRAMUSCULAR | Status: DC | PRN
  Administered 2020-01-26: 4 mg via INTRAVENOUS
  Filled 2020-01-26: qty 2

## 2020-01-26 MED ORDER — ACETAMINOPHEN 325 MG PO TABS
650.0000 mg | ORAL_TABLET | ORAL | Status: DC | PRN
  Filled 2020-01-26: qty 2

## 2020-01-26 MED ORDER — LACTATED RINGERS IV SOLN: 500.0000 mL | Freq: Once | INTRAVENOUS | Status: DC

## 2020-01-26 MED ORDER — PHENYLEPHRINE 40 MCG/ML (10ML) SYRINGE FOR IV PUSH (FOR BLOOD PRESSURE SUPPORT): 80.0000 ug | PREFILLED_SYRINGE | INTRAVENOUS | Status: DC | PRN

## 2020-01-26 MED ORDER — TETANUS-DIPHTH-ACELL PERTUSSIS 5-2.5-18.5 LF-MCG/0.5 IM SUSP: 0.5000 mL | Freq: Once | INTRAMUSCULAR | Status: DC

## 2020-01-26 MED ORDER — METHYLERGONOVINE MALEATE 0.2 MG PO TABS: 0.2000 mg | ORAL_TABLET | ORAL | Status: DC | PRN

## 2020-01-26 MED ORDER — WITCH HAZEL-GLYCERIN EX PADS
1.0000 "application " | MEDICATED_PAD | CUTANEOUS | Status: DC | PRN
  Administered 2020-01-27: 1 via TOPICAL

## 2020-01-26 MED ORDER — LACTATED RINGERS IV SOLN: 500.0000 mL | INTRAVENOUS | Status: DC | PRN

## 2020-01-26 MED ORDER — DIBUCAINE (PERIANAL) 1 % EX OINT
1.0000 "application " | TOPICAL_OINTMENT | CUTANEOUS | Status: DC | PRN
  Filled 2020-01-26: qty 28

## 2020-01-26 MED ORDER — DIPHENHYDRAMINE HCL 50 MG/ML IJ SOLN: 12.5000 mg | INTRAMUSCULAR | Status: DC | PRN

## 2020-01-26 MED ORDER — ZOLPIDEM TARTRATE 5 MG PO TABS: 5.0000 mg | ORAL_TABLET | Freq: Every evening | ORAL | Status: DC | PRN

## 2020-01-26 MED ORDER — FENTANYL CITRATE (PF) 100 MCG/2ML IJ SOLN: 50.0000 ug | INTRAMUSCULAR | Status: DC | PRN

## 2020-01-26 MED ORDER — METHYLERGONOVINE MALEATE 0.2 MG/ML IJ SOLN: 0.2000 mg | INTRAMUSCULAR | Status: DC | PRN

## 2020-01-26 MED ORDER — BENZOCAINE-MENTHOL 20-0.5 % EX AERO
1.0000 "application " | INHALATION_SPRAY | CUTANEOUS | Status: DC | PRN
  Administered 2020-01-27: 1 via TOPICAL
  Filled 2020-01-26: qty 56

## 2020-01-26 MED ORDER — ACETAMINOPHEN 325 MG PO TABS: 650.0000 mg | ORAL_TABLET | ORAL | Status: DC | PRN

## 2020-01-26 MED ORDER — PRENATAL MULTIVITAMIN CH
1.0000 | ORAL_TABLET | Freq: Every day | ORAL | Status: DC
  Administered 2020-01-27: 1 via ORAL
  Filled 2020-01-26: qty 1

## 2020-01-26 MED ORDER — OXYTOCIN 40 UNITS IN NORMAL SALINE INFUSION - SIMPLE MED: 2.5000 [IU]/h | INTRAVENOUS | Status: DC

## 2020-01-26 MED ORDER — OXYTOCIN 40 UNITS IN NORMAL SALINE INFUSION - SIMPLE MED
1.0000 m[IU]/min | INTRAVENOUS | Status: DC
  Filled 2020-01-26: qty 1000

## 2020-01-26 MED ORDER — IBUPROFEN 600 MG PO TABS
600.0000 mg | ORAL_TABLET | Freq: Four times a day (QID) | ORAL | Status: DC
  Administered 2020-01-26 – 2020-01-27 (×3): 600 mg via ORAL
  Filled 2020-01-26 (×4): qty 1

## 2020-01-26 MED ORDER — SOD CITRATE-CITRIC ACID 500-334 MG/5ML PO SOLN: 30.0000 mL | ORAL | Status: DC | PRN

## 2020-01-26 MED ORDER — OXYCODONE HCL 5 MG PO TABS: 10.0000 mg | ORAL_TABLET | ORAL | Status: DC | PRN

## 2020-01-26 MED ORDER — SENNOSIDES-DOCUSATE SODIUM 8.6-50 MG PO TABS
2.0000 | ORAL_TABLET | ORAL | Status: DC
  Administered 2020-01-26: 2 via ORAL
  Filled 2020-01-26: qty 2

## 2020-01-26 MED ORDER — LACTATED RINGERS IV SOLN: INTRAVENOUS | Status: DC

## 2020-01-26 MED ORDER — FENTANYL-BUPIVACAINE-NACL 0.5-0.125-0.9 MG/250ML-% EP SOLN: 12.0000 mL/h | EPIDURAL | Status: DC | PRN

## 2020-01-26 MED ORDER — ONDANSETRON HCL 4 MG/2ML IJ SOLN: 4.0000 mg | INTRAMUSCULAR | Status: DC | PRN

## 2020-01-26 MED ORDER — SIMETHICONE 80 MG PO CHEW: 80.0000 mg | CHEWABLE_TABLET | ORAL | Status: DC | PRN

## 2020-01-26 MED ORDER — TRANEXAMIC ACID-NACL 1000-0.7 MG/100ML-% IV SOLN: 1000.0000 mg | INTRAVENOUS | Status: DC

## 2020-01-26 MED ORDER — TRANEXAMIC ACID-NACL 1000-0.7 MG/100ML-% IV SOLN
INTRAVENOUS | Status: AC
  Administered 2020-01-26: 1000 mg
  Filled 2020-01-26: qty 100

## 2020-01-26 NOTE — Progress Notes (Signed)
Met Mrs. Cullimore in her L and D room. She had pain 7/10 after AROM. She has had an epidural last time that worked well. She is not opposed to an epidural this time but may try to use Nitrous Oxide only. I answered all questions. RN was taking N2O to the room. She has no pertinent medical history or family history of anesthetic complications. She had a brain tumor removed as a child and did well. No sequelae.

## 2020-01-27 ENCOUNTER — Other Ambulatory Visit: Payer: Self-pay

## 2020-01-27 LAB — CBC
HCT: 31.4 % — ABNORMAL LOW (ref 36.0–46.0)
Hemoglobin: 10.5 g/dL — ABNORMAL LOW (ref 12.0–15.0)
MCH: 28.8 pg (ref 26.0–34.0)
MCHC: 33.4 g/dL (ref 30.0–36.0)
MCV: 86.3 fL (ref 80.0–100.0)
Platelets: 138 10*3/uL — ABNORMAL LOW (ref 150–400)
RBC: 3.64 MIL/uL — ABNORMAL LOW (ref 3.87–5.11)
RDW: 12.3 % (ref 11.5–15.5)
WBC: 15 10*3/uL — ABNORMAL HIGH (ref 4.0–10.5)
nRBC: 0 % (ref 0.0–0.2)

## 2020-01-27 LAB — RH IG WORKUP (INCLUDES ABO/RH)
ABO/RH(D): B NEG
Gestational Age(Wks): 39.2

## 2020-01-27 MED ORDER — RHO D IMMUNE GLOBULIN 1500 UNIT/2ML IJ SOSY
300.0000 ug | PREFILLED_SYRINGE | Freq: Once | INTRAMUSCULAR | Status: AC
  Administered 2020-01-27: 300 ug via INTRAVENOUS
  Filled 2020-01-27: qty 2

## 2020-01-27 NOTE — Lactation Note (Signed)
This note was copied from a baby's chart. Lactation Consultation Note  Patient Name: Teresa Rubio [id number] Date: 01/27/2020 Reason for consult: Follow-up assessment;Term  1210-1219 F/U visit with P2 mom, baby is now 25hrs old with 4%wt loss, anticipated early d/c later today.  LC entered room to find mom in bed with baby latched to left breast in cradle hold position. Mom denies any pain/discomfort with latch other than expected mild soreness from frequency and length of feedings. Mom states she has a Spectra DEBP for use at home. Mom reports surplus of milk supply with first child.  Reviewed alternating positions and breasts each feeding. Infant currently with textbook latch and detached independently. Mom's nipple soft and elongated/round. Mom states she can see milk residue around baby's mouth after feedings. Mom given coconut oil and comfort gels by MB RN. Reviewed use of each and stressed importance of not using together.   Reviewed expected output of exclusive breastfeeding in the early days. Reviewed prevention of engorgement and treatment with ice not heat. Encouraged mom to support baby to move the milk vs pumping but only pump to comfort if necessary. Reviewed lactation phone number and OP services. Support group information reviewed.  Maternal Data Does the patient have breastfeeding experience prior to this delivery?: Yes  Feeding Feeding Type: Breast Fed  LATCH Score Latch: Grasps breast easily, tongue down, lips flanged, rhythmical sucking.  Audible Swallowing: Spontaneous and intermittent  Type of Nipple: Everted at rest and after stimulation  Comfort (Breast/Nipple): Soft / non-tender  Hold (Positioning): No assistance needed to correctly position infant at breast.  LATCH Score: 10  Interventions Interventions: Breast feeding basics reviewed;Skin to skin;Hand express;Expressed milk;Position options;DEBP   Consult Status Consult Status:  Complete    Cranston Neighbor 01/27/2020, 12:21 PM

## 2020-01-27 NOTE — Discharge Summary (Addendum)
Obstetric Discharge Summary Reason for Admission: induction of labor elective Prenatal Procedures: ultrasound Intrapartum Procedures: spontaneous vaginal delivery Postpartum Procedures: antibiotics s/p manual extraction of placenta Complications-Operative and Postpartum: 2nd degree perineal laceration, retained placenta with manual extraction, PPH requiring methergine/TXA and stitch of cervical bleeder Hemoglobin  Date Value Ref Range Status  01/27/2020 10.5 (L) 12.0 - 15.0 g/dL Final   HCT  Date Value Ref Range Status  01/27/2020 31.4 (L) 36.0 - 46.0 % Final    Physical Exam:  General: alert and cooperative Lochia: appropriate Uterine Fundus: firm DVT Evaluation: No evidence of DVT seen on physical exam.  Discharge Diagnoses: Term Pregnancy-delivered  Discharge Information: Date: 01/27/2020 Activity: pelvic rest Diet: routine Medications: PNV and Ibuprofen Condition: stable Instructions: refer to practice specific booklet Discharge to: home Follow-up Information    Vanessa Kick, MD Follow up in 4 week(s).   Specialty: Obstetrics and Gynecology Contact information: [address] [place] [address] [phone]           Newborn Data: Live born female  Birth Weight: 7 lb 12.5 oz (3530 g) APGAR: 75, 9  Newborn Delivery   Birth date/time: 01/26/2019 11:20:00 Delivery type: Vaginal, Spontaneous      Home with mother.  Allyn Kenner 01/27/2020, 10:37 AM

## 2020-01-27 NOTE — Lactation Note (Signed)
This note was copied from a baby's chart. Lactation Consultation Note  Patient Name: Teresa Rubio [id number] Date: 01/27/2020 Reason for consult: Initial assessment;Term P2, 72 hour female infant. Infant had 3 voids and 5 stools since birth. Per mom, she breastfed her 33 year old son for 23 months. Per mom, infant is breastfeeding for 10-30 minutes most feedings, she is experiencing pain with some of the latches feels infant latch is not deep enough, but Mom  doesn't want assistance with latching infant at breast at this time, she prefers to wait  until dayshift. Mom requested comfort gels which LC gave to mom, LC unable to do a breast assessment of breast tissue due to lights being off in room and mom not wanting to disturb infant. Mom knows the risk of pacifier usage but prefers to give infant a pacifier at this time, infant sucking on pacifer while LC in room.BM.  Mo knows to breastfeed according to hunger cues, 8 to 12 times within 24 hours and on demand. Mom knows to call RN or LC if she has any questions, concerns or need assistance with latching infant at breast. Mom will do as much STS as possible with infant. Reviewed Baby & Me book's Breastfeeding Basics.  Mom made aware of O/P services, breastfeeding support groups, community resources, and our phone # for post-discharge questions.    Maternal Data Formula Feeding for Exclusion: No Has patient been taught Hand Expression?: Yes Does the patient have breastfeeding experience prior to this delivery?: Yes  Feeding Feeding Type: Breast Fed  LATCH Score Latch: Grasps breast easily, tongue down, lips flanged, rhythmical sucking.  Audible Swallowing: A few with stimulation  Type of Nipple: Everted at rest and after stimulation  Comfort (Breast/Nipple): Soft / non-tender  Hold (Positioning): No assistance needed to correctly position infant at breast.  LATCH Score: 9  Interventions Interventions: Skin to skin;Breast  feeding basics reviewed;Comfort gels  Lactation Tools Discussed/Used WIC Program: No   Consult Status Consult Status: Follow-up Date: 01/27/20 Follow-up type: In-patient    Vicente Serene 01/27/2020, 4:35 AM

## 2020-01-28 LAB — RH IG WORKUP (INCLUDES ABO/RH)
ABO/RH(D): B NEG
Fetal Screen: NEGATIVE
Gestational Age(Wks): 39
Unit division: 0

## 2020-01-30 LAB — SURGICAL PATHOLOGY

## 2020-01-30 NOTE — H&P (Signed)
Teresa Rubio is a 34 y.o. female presenting for EIOL  34 yo G2P1001 @ 39+ who presents for EIOL. Her pregnancy has been uncomplicated to this point  OB History as of 01/27/2020    Gravida  2   Para  2   Term  2   Preterm      AB      Living  2     SAB      TAB      Ectopic      Multiple  0   Live Births  2          Past Medical History:  Diagnosis Date  . 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 BRAIN SURGERY    . breast biopsy    . WISDOM TOOTH EXTRACTION     Family History: family history includes Diabetes in her father and maternal grandfather; Emphysema in her maternal grandfather; Heart disease in her maternal grandfather and maternal grandmother; Hyperlipidemia in her mother; Hypertension in her maternal grandmother and mother. Social History:  reports that she has never smoked. She has never used smokeless tobacco. She reports current alcohol use. She reports that she does not use drugs.     Maternal Diabetes: No Genetic Screening: Normal Maternal Ultrasounds/Referrals: Normal Fetal Ultrasounds or other Referrals:  None Maternal Substance Abuse:  No Significant Maternal Medications:  None Significant Maternal Lab Results:  None Other Comments:  None  Review of Systems History Dilation: 10 Effacement (%): 100 Station: Plus 1 Exam by:: Willliam Pettet Blood pressure 101/67, pulse 81, temperature 98.3 F (36.8 C), temperature source Oral, resp. rate 18, height 5\' 5"  (1.651 m), weight 73.4 kg, last menstrual period 02/17/2019, SpO2 98 %, unknown if currently breastfeeding. Exam Physical Exam  Prenatal labs: ABO, Rh: --/--/B NEG (03/12 0446) Antibody: POS (03/11 0837) Rubella: Immune (08/20 0000) RPR: NON REACTIVE (03/11 0852)  HBsAg: Negative (08/20 0000)  HIV: Non-reactive (08/20 0000)  GBS:     Assessment/Plan: 1) Admit 2) Epidural on request 3) AROM/Pit    Vanessa Kick 01/30/2020, 2:00  PM

## 2020-05-24 ENCOUNTER — Other Ambulatory Visit: Payer: Self-pay | Admitting: Obstetrics and Gynecology

## 2020-05-24 DIAGNOSIS — D241 Benign neoplasm of right breast: Secondary | ICD-10-CM

## 2020-06-19 ENCOUNTER — Other Ambulatory Visit: Payer: Self-pay

## 2020-06-19 ENCOUNTER — Ambulatory Visit
Admission: RE | Admit: 2020-06-19 | Discharge: 2020-06-19 | Disposition: A | Discharge status: Home / Self Care | Payer: BC Managed Care – PPO | Source: Ambulatory Visit | Attending: Obstetrics and Gynecology | Admitting: Obstetrics and Gynecology

## 2020-06-19 ENCOUNTER — Other Ambulatory Visit: Payer: Self-pay | Admitting: Obstetrics and Gynecology

## 2020-06-19 DIAGNOSIS — D241 Benign neoplasm of right breast: Secondary | ICD-10-CM

## 2020-06-19 DIAGNOSIS — N6310 Unspecified lump in the right breast, unspecified quadrant: Secondary | ICD-10-CM

## 2020-06-26 ENCOUNTER — Ambulatory Visit
Admission: RE | Admit: 2020-06-26 | Discharge: 2020-06-26 | Disposition: A | Discharge status: Home / Self Care | Payer: BC Managed Care – PPO | Source: Ambulatory Visit | Attending: Obstetrics and Gynecology | Admitting: Obstetrics and Gynecology

## 2020-06-26 ENCOUNTER — Other Ambulatory Visit: Payer: Self-pay

## 2020-06-26 DIAGNOSIS — N6310 Unspecified lump in the right breast, unspecified quadrant: Secondary | ICD-10-CM

## 2021-10-04 ENCOUNTER — Other Ambulatory Visit: Payer: Self-pay | Admitting: Obstetrics and Gynecology

## 2021-10-04 DIAGNOSIS — N6310 Unspecified lump in the right breast, unspecified quadrant: Secondary | ICD-10-CM

## 2022-06-29 IMAGING — MG MM BREAST LOCALIZATION CLIP
4 series · 4 of 12 positions shown · non-contrast
Comparison: Previous exam(s).

CLINICAL DATA: Status post ultrasound-guided core needle biopsies
of adjacent 1.2 cm and 0.8 cm masses in the 9 o'clock position of
the right breast.

EXAM:
DIAGNOSTIC RIGHT MAMMOGRAM POST ULTRASOUND BIOPSY X 2

[R ML synth-2D]
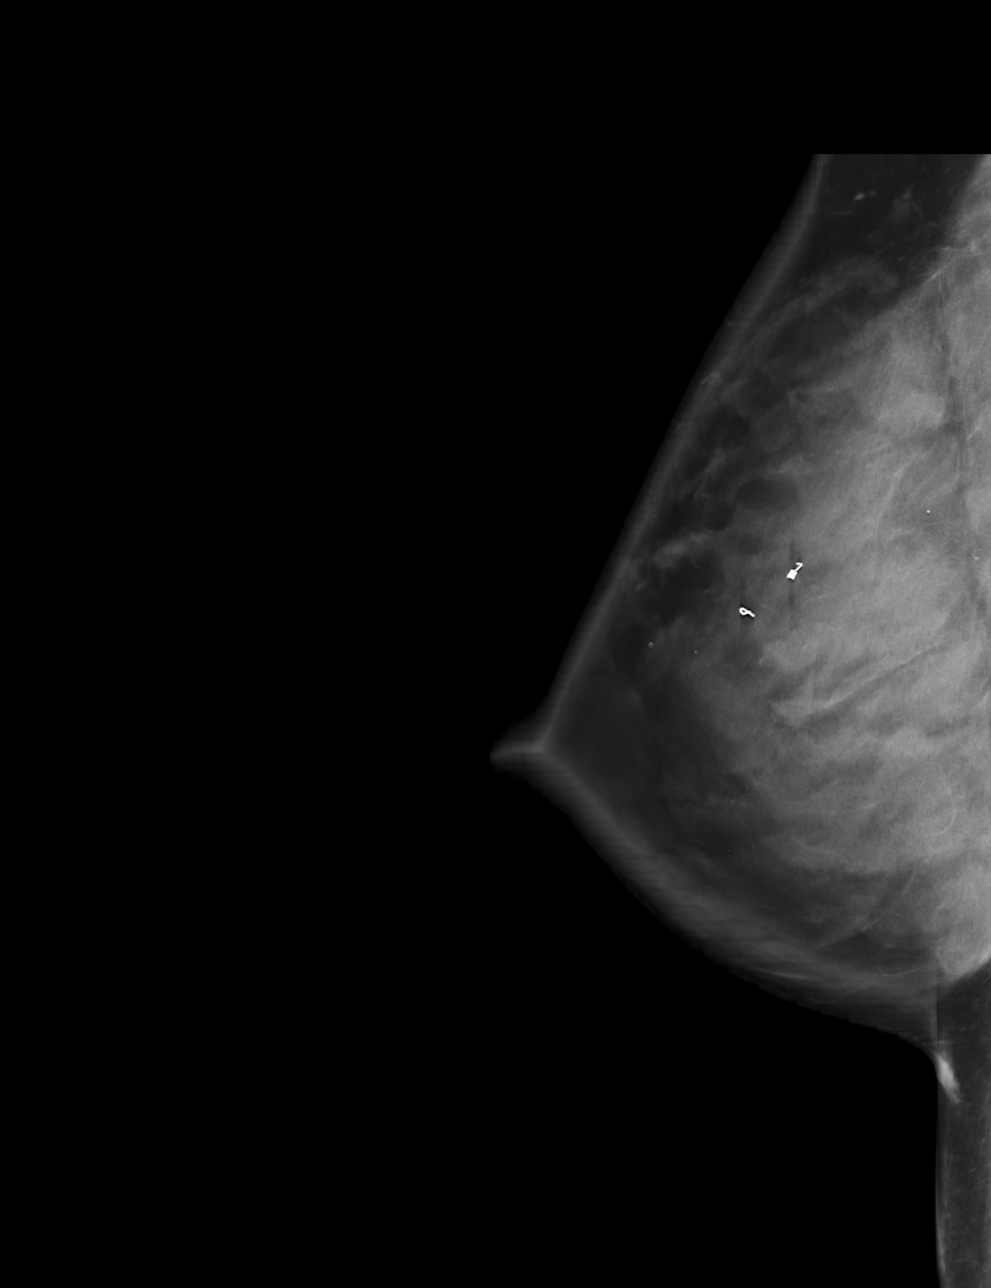

[R CC synth-2D]
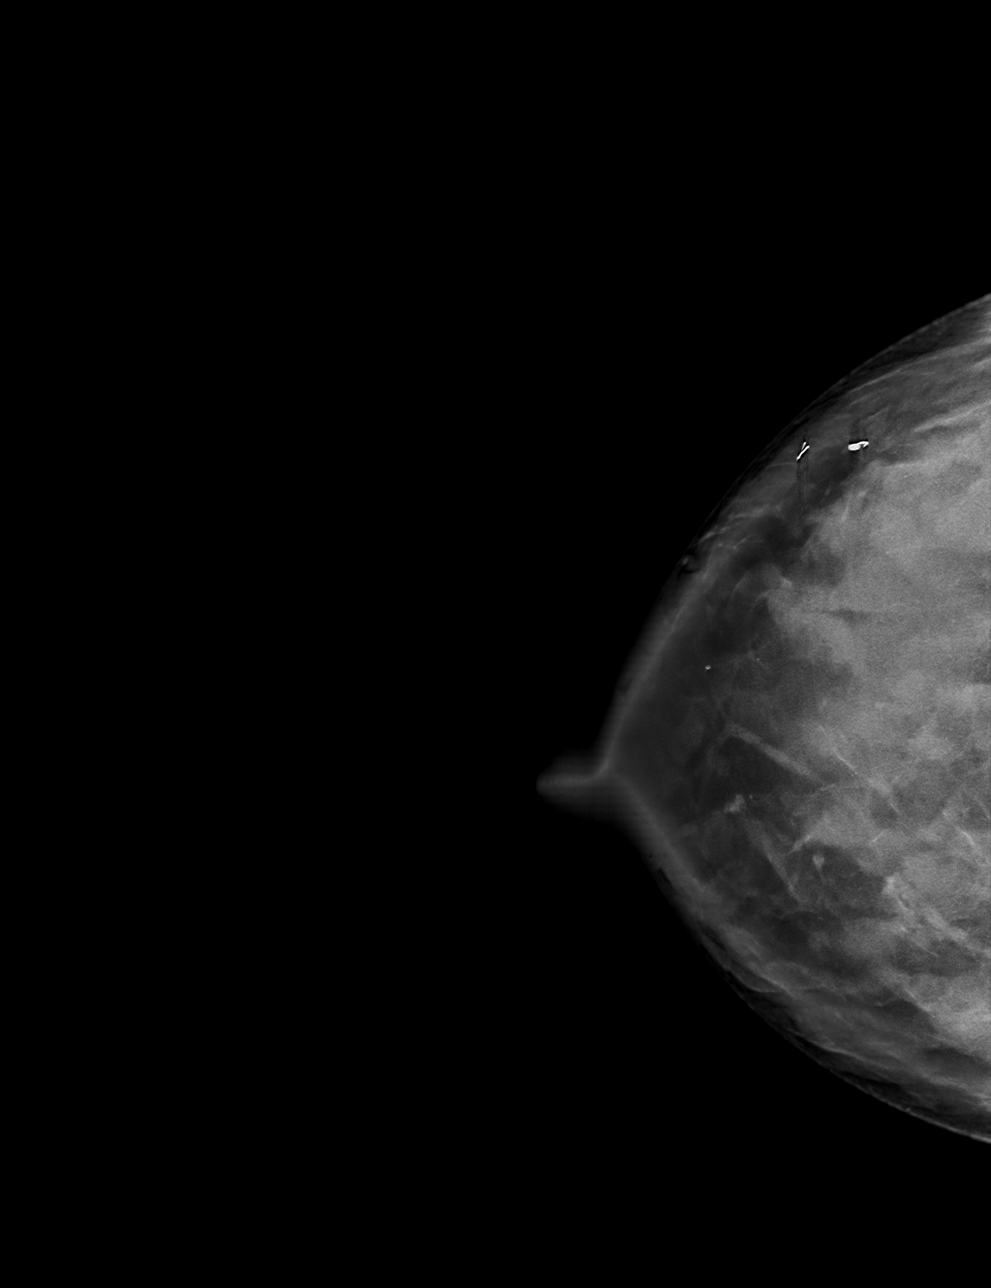

[R ML tomo · tomo slice 53/105.0]
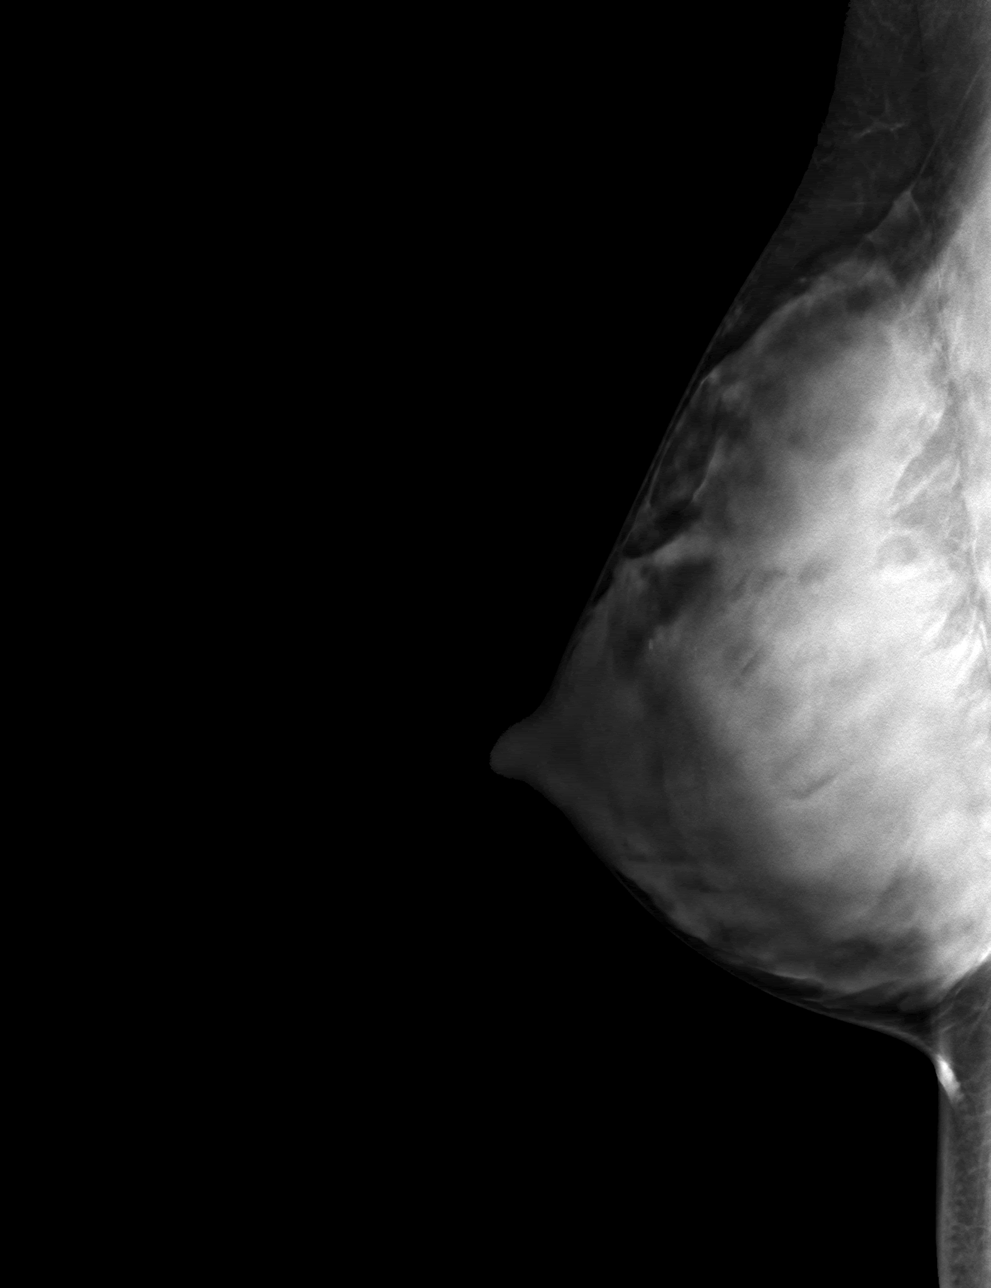

[R CC tomo · tomo slice 46/91.0]
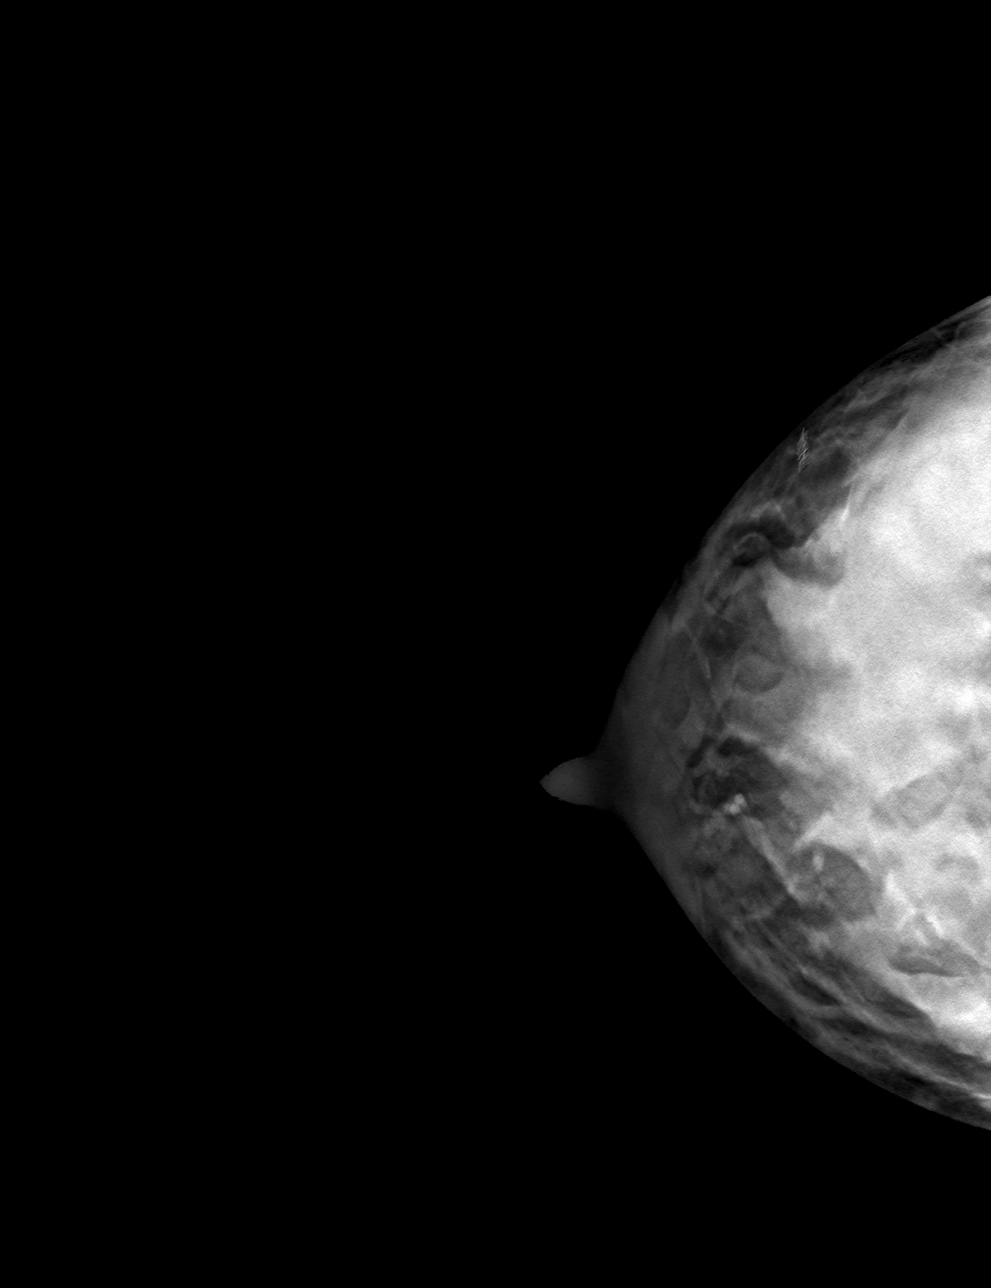

[4 of 12 positions shown; findings below may reference images not displayed]

FINDINGS: Mammographic images were obtained following ultrasound guided biopsy
of the recently demonstrated 1.2 cm and 0.8 cm masses in the 9
o'clock position of the right breast. The biopsy marking clips are
in expected positions at the sites of biopsy.
IMPRESSION: Appropriate positioning of the ribbon shaped biopsy marking clip at
the location of the biopsied 0.8 cm mass in the outer right breast
and appropriate positioning of the coil shaped biopsy marker at the
location of the biopsied 1.2 cm mass in the outer right breast.

Final Assessment: Post Procedure Mammograms for Marker Placement

## 2022-06-29 IMAGING — US US  BREAST BX W/ LOC DEV 1ST LESION IMG BX SPEC US GUIDE*R*
1 series · 15 of 15 positions shown · non-contrast
Comparison: Previous exam(s).
COMPARISON: Previous exam(s).

Addendum:
CLINICAL DATA: Adjacent 1.2 cm and 0.8 cm masses in the 9 o'clock
position of the right breast. The patient is breast feeding.

EXAM:
ULTRASOUND GUIDED RIGHT BREAST CORE NEEDLE BIOPSY X 2

[Series 1: us breast bx w/ loc dev 1st lesion img bx spec us  · 0.05mm/px · 15 of 15 slices shown]
[im 1/15]
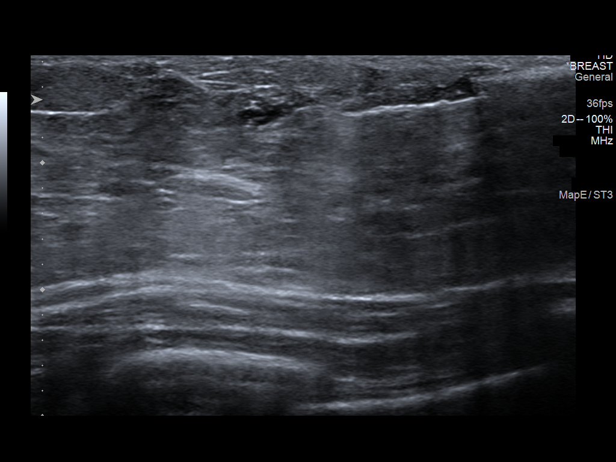
[im 2/15]
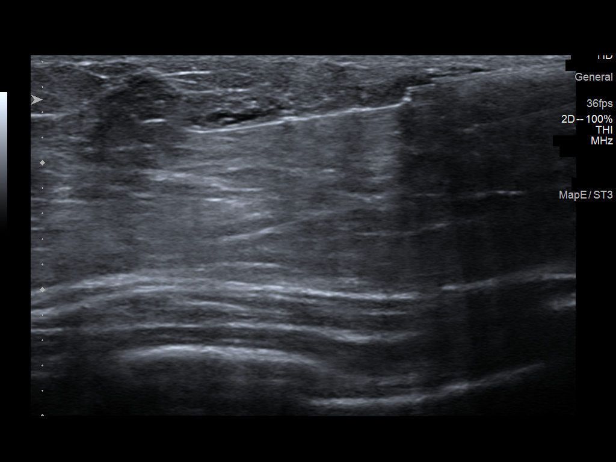
[im 3/15]
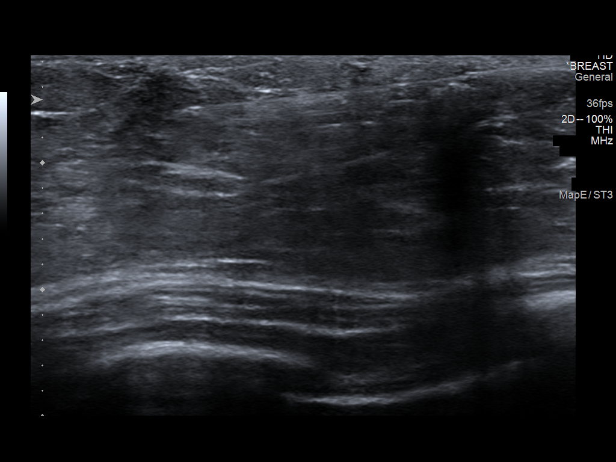
[im 4/15]
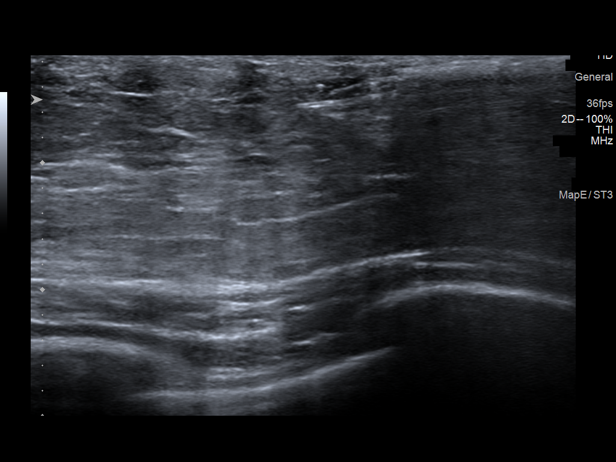
[im 5/15]
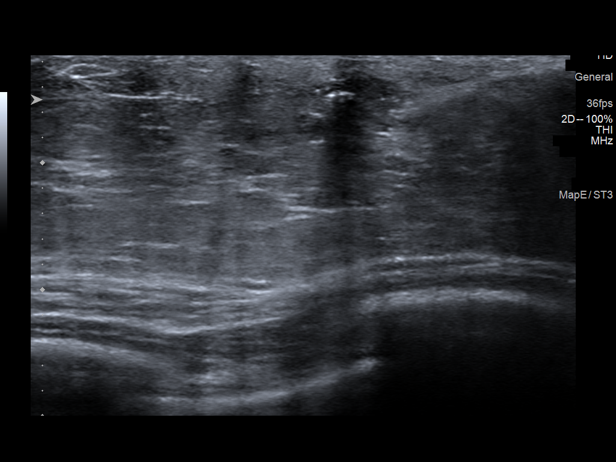
[im 6/15]
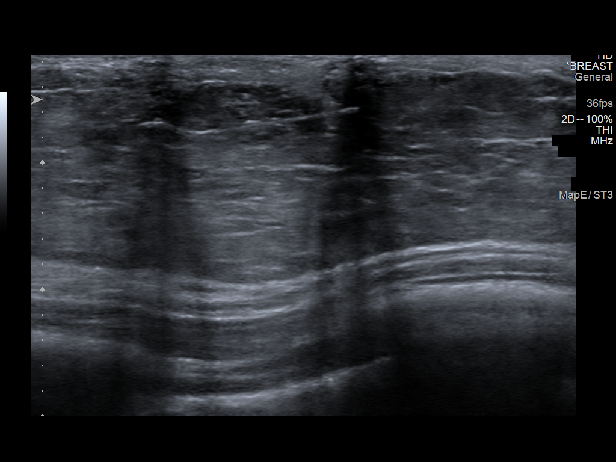
[im 7/15]
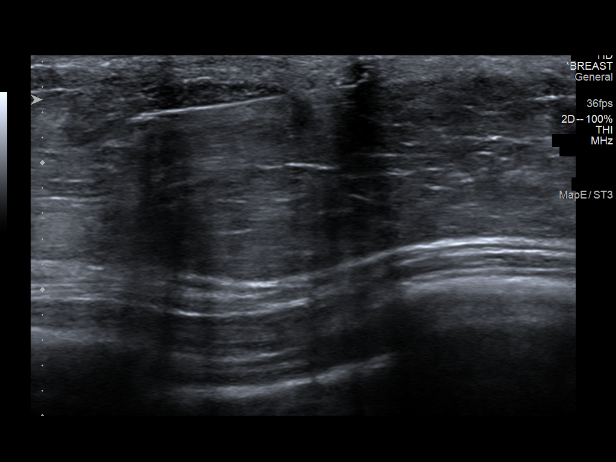
[im 8/15]
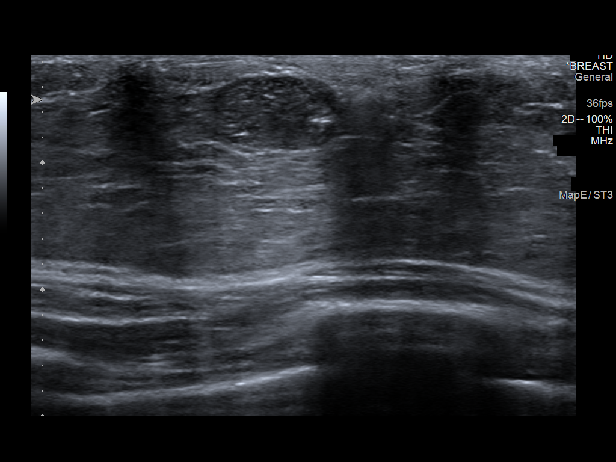
[im 9/15]
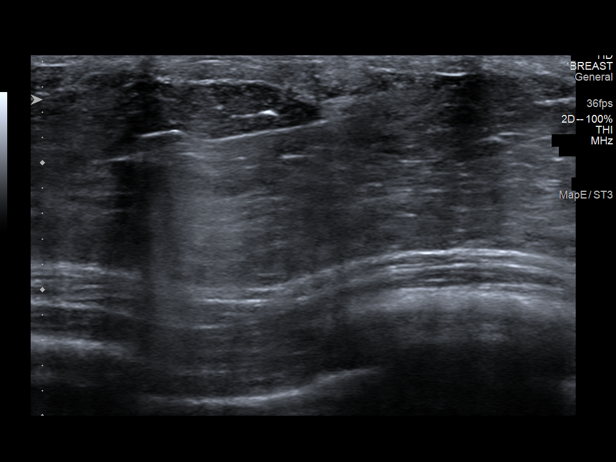
[im 10/15]
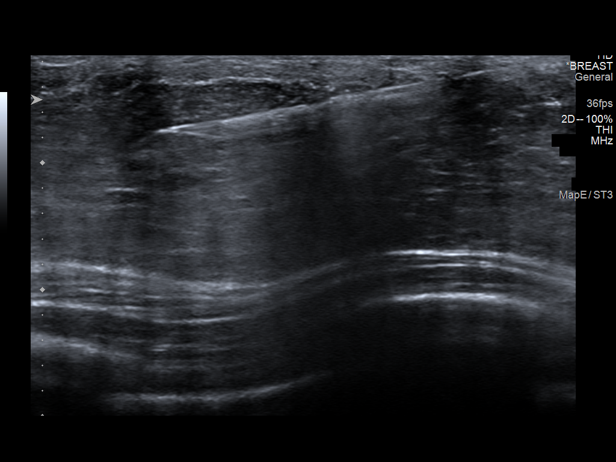
[im 11/15]
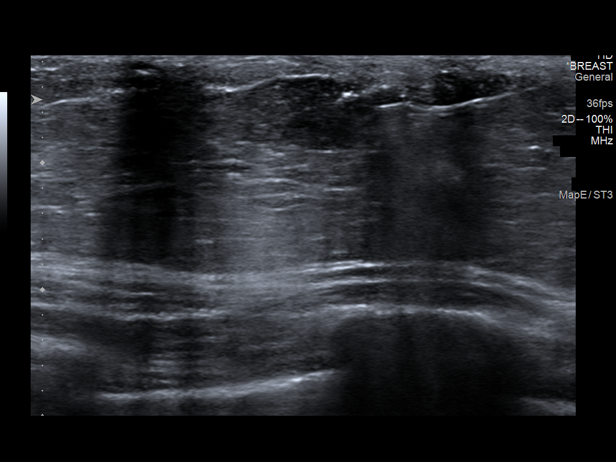
[im 12/15]
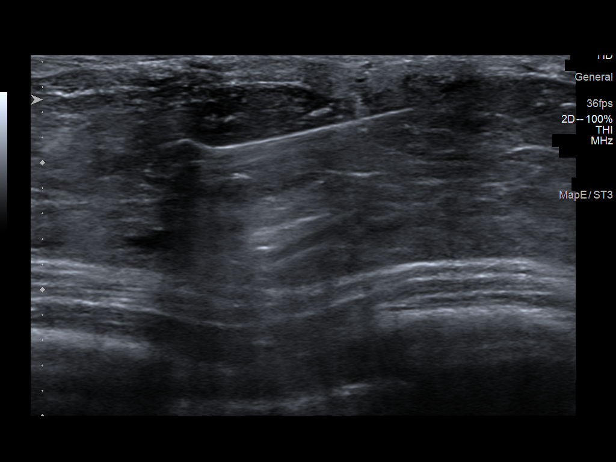
[im 13/15]
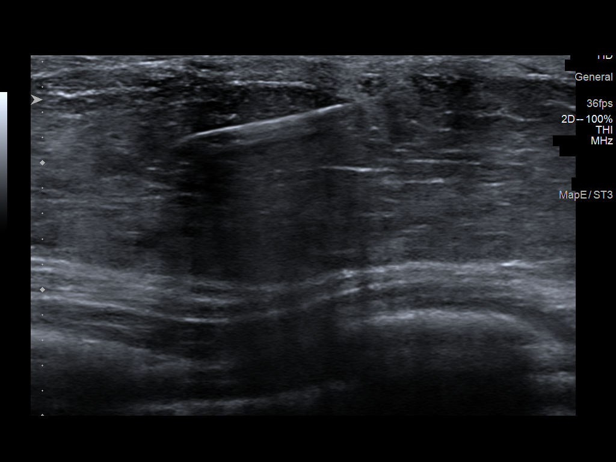
[im 14/15]
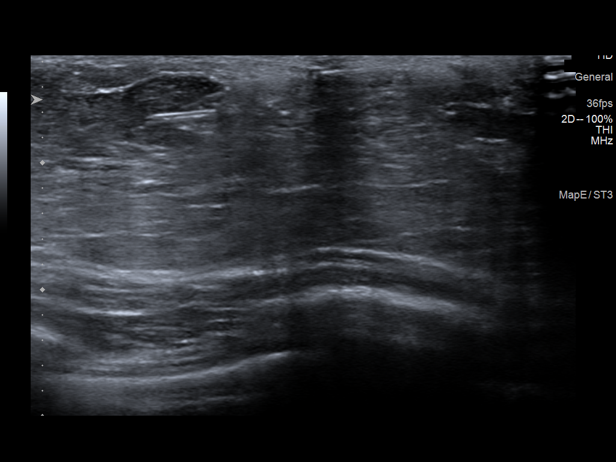
[im 15/15]
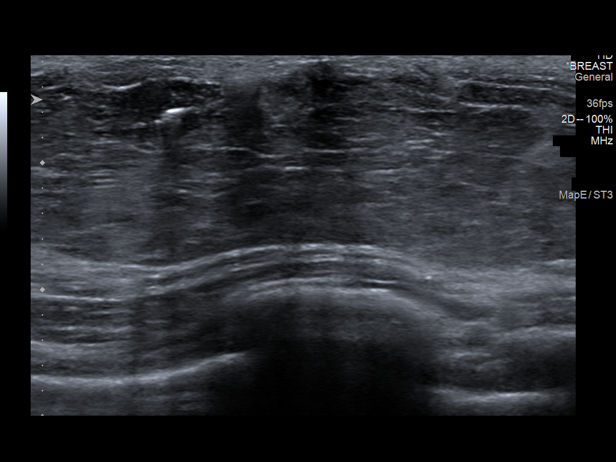

[15 of 15 positions shown; findings below may reference images not displayed]

PROCEDURE:
I met with the patient and we discussed the procedure of
ultrasound-guided biopsy, including benefits and alternatives. We
discussed the high likelihood of successful procedures. We discussed
the risks of the procedures, including infection, bleeding, tissue
injury, clip migration, milk fistula and inadequate sampling.
Informed written consent was given. The usual time-out protocol was
performed immediately prior to the procedures.

Prior to the procedures, the patient reported recently feeling a
mass in the lower inner quadrant of the right breast. On physical
examination, there was a small, oval, mass-like palpable area at
that location. This was evaluated with ultrasound, demonstrating
normal appearing breast tissue.

SITE #1: 0.8 CM MASS 9 [REDACTED] RIGHT BREAST

Using sterile technique and 1% Lidocaine as local anesthetic, under
direct ultrasound visualization, a 14 gauge Rendy device was
used to perform biopsy of the recently demonstrated 0.8 cm mass in
the 9 o'clock position of the right breast using an inferolateral
approach. As positioned for the biopsies today, this was located 2
cm from the nipple. At the conclusion of the procedure a ribbon
shaped tissue marker clip was deployed into the biopsy cavity.
Follow up 2 view mammogram was performed and dictated separately.

SITE #2: 1.2 CM MASS 9 [REDACTED] RIGHT BREAST

Using sterile technique and 1% Lidocaine as local anesthetic, under
direct ultrasound visualization, a 14 gauge Rendy device was
used to perform biopsy of the recently demonstrated 1.2 cm mass in
the 9 o'clock position of the right breast using a caudal approach.
As positioned for the biopsies today, this was located 2 cm from the
nipple. At the conclusion of the procedure a coil shaped tissue
marker clip was deployed into the biopsy cavity. Follow up 2 view
mammogram was performed and dictated separately.
IMPRESSION: Ultrasound guided biopsy of the recently demonstrated 1.2 cm and
cm masses in the 9 o'clock position of the right breast. No apparent
complications.

ADDENDUM:
Pathology revealed ADENOMA WITH LACTATIONAL CHANGES of the RIGHT
breast, 9 o'clock, 0cmfn. This was found to be concordant by Dr.
Charal[place]ia Saparilla.

Pathology revealed FIBROADENOMA WITH LACTATIONAL CHANGES of the
RIGHT breast, 9 o'clock, 0cmfn. This was found to be concordant by
Dr. [place]ia Saparilla.

Pathology results were discussed with the patient by telephone. The
patient reported doing well after the biopsies with tenderness at
the sites. Post biopsy instructions and care were reviewed and
questions were answered. The patient was encouraged to call The

The patient was instructed to continue with monthly self breast
examinations, clinical follow-up as needed, and to return for annual
mammography at 41. The patient was informed a reminder notice would
be sent regarding this appointment.

Pathology results reported by Tup Duende Pallazhco Franco RN on 06/27/2020.

*** End of Addendum ***
PROCEDURE:
I met with the patient and we discussed the procedure of
ultrasound-guided biopsy, including benefits and alternatives. We
discussed the high likelihood of successful procedures. We discussed
the risks of the procedures, including infection, bleeding, tissue
injury, clip migration, milk fistula and inadequate sampling.
Informed written consent was given. The usual time-out protocol was
performed immediately prior to the procedures.

Prior to the procedures, the patient reported recently feeling a
mass in the lower inner quadrant of the right breast. On physical
examination, there was a small, oval, mass-like palpable area at
that location. This was evaluated with ultrasound, demonstrating
normal appearing breast tissue.

SITE #1: 0.8 CM MASS 9 [REDACTED] RIGHT BREAST

Using sterile technique and 1% Lidocaine as local anesthetic, under
direct ultrasound visualization, a 14 gauge Rendy device was
used to perform biopsy of the recently demonstrated 0.8 cm mass in
the 9 o'clock position of the right breast using an inferolateral
approach. As positioned for the biopsies today, this was located 2
cm from the nipple. At the conclusion of the procedure a ribbon
shaped tissue marker clip was deployed into the biopsy cavity.
Follow up 2 view mammogram was performed and dictated separately.

SITE #2: 1.2 CM MASS 9 [REDACTED] RIGHT BREAST

Using sterile technique and 1% Lidocaine as local anesthetic, under
direct ultrasound visualization, a 14 gauge Rendy device was
used to perform biopsy of the recently demonstrated 1.2 cm mass in
the 9 o'clock position of the right breast using a caudal approach.
As positioned for the biopsies today, this was located 2 cm from the
nipple. At the conclusion of the procedure a coil shaped tissue
marker clip was deployed into the biopsy cavity. Follow up 2 view
mammogram was performed and dictated separately.
IMPRESSION: Ultrasound guided biopsy of the recently demonstrated 1.2 cm and
cm masses in the 9 o'clock position of the right breast. No apparent
complications.
